# Patient Record
Sex: Female | Born: 1935 | Race: White | Hispanic: No
Health system: Southern US, Community
[De-identification: ages and names within clinical notes are randomized; demographics above are authoritative.]

---

## 2005-03-17 ENCOUNTER — Ambulatory Visit: Payer: Self-pay | Admitting: Family Medicine

## 2006-03-22 ENCOUNTER — Ambulatory Visit: Payer: Self-pay | Admitting: Family Medicine

## 2006-04-19 ENCOUNTER — Ambulatory Visit: Payer: Self-pay | Admitting: Ophthalmology

## 2006-08-26 ENCOUNTER — Ambulatory Visit: Payer: Self-pay | Admitting: Family Medicine

## 2007-05-30 ENCOUNTER — Inpatient Hospital Stay: Payer: Self-pay | Admitting: Otolaryngology

## 2007-06-03 ENCOUNTER — Inpatient Hospital Stay: Payer: Self-pay | Admitting: Internal Medicine

## 2010-02-04 ENCOUNTER — Ambulatory Visit: Payer: Self-pay | Admitting: Family Medicine

## 2010-08-12 ENCOUNTER — Ambulatory Visit: Payer: Self-pay | Admitting: Family Medicine

## 2011-03-20 ENCOUNTER — Emergency Department: Payer: Self-pay | Admitting: Unknown Physician Specialty

## 2012-01-15 ENCOUNTER — Emergency Department: Payer: Self-pay | Admitting: *Deleted

## 2012-03-13 ENCOUNTER — Emergency Department: Payer: Self-pay | Admitting: Emergency Medicine

## 2012-03-13 LAB — URINALYSIS, COMPLETE
Hyaline Cast: 15
Nitrite: POSITIVE
Ph: 6 (ref 4.5–8.0)
RBC,UR: 11 /HPF (ref 0–5)
Specific Gravity: 1.015 (ref 1.003–1.030)
Squamous Epithelial: <1
WBC UR: 40 /HPF (ref 0–5)

## 2012-03-15 ENCOUNTER — Emergency Department: Payer: Self-pay | Admitting: Emergency Medicine

## 2012-03-15 LAB — COMPREHENSIVE METABOLIC PANEL
Alkaline Phosphatase: 65 U/L (ref 50–136)
Anion Gap: 10 (ref 7–16)
BUN: 29 mg/dL — ABNORMAL HIGH (ref 7–18)
Bilirubin,Total: 0.5 mg/dL (ref 0.2–1.0)
Calcium, Total: 9.7 mg/dL (ref 8.5–10.1)
Chloride: 106 mmol/L (ref 98–107)
Co2: 25 mmol/L (ref 21–32)
Creatinine: 1.57 mg/dL — ABNORMAL HIGH (ref 0.60–1.30)
EGFR (African American): 40 — ABNORMAL LOW
Glucose: 95 mg/dL (ref 65–99)
Potassium: 3.6 mmol/L (ref 3.5–5.1)
Sodium: 141 mmol/L (ref 136–145)

## 2012-03-15 LAB — CBC
HGB: 14.2 g/dL (ref 12.0–16.0)
MCH: 31.7 pg (ref 26.0–34.0)
MCHC: 33.7 g/dL (ref 32.0–36.0)
RDW: 13.3 % (ref 11.5–14.5)

## 2012-04-18 ENCOUNTER — Emergency Department: Payer: Self-pay | Admitting: Emergency Medicine

## 2012-04-18 LAB — CBC WITH DIFFERENTIAL/PLATELET
Basophil #: 0.1 10*3/uL (ref 0.0–0.1)
Eosinophil #: 0.2 10*3/uL (ref 0.0–0.7)
HCT: 40.5 % (ref 35.0–47.0)
HGB: 13.8 g/dL (ref 12.0–16.0)
Lymphocyte #: 2.5 10*3/uL (ref 1.0–3.6)
Lymphocyte %: 41.7 %
MCHC: 34 g/dL (ref 32.0–36.0)
MCV: 93 fL (ref 80–100)
Monocyte #: 0.5 x10 3/mm (ref 0.2–0.9)
Monocyte %: 8.9 %
Neutrophil #: 2.7 10*3/uL (ref 1.4–6.5)
Neutrophil %: 45.5 %
Platelet: 171 10*3/uL (ref 150–440)
RBC: 4.37 10*6/uL (ref 3.80–5.20)

## 2012-04-18 LAB — URINALYSIS, COMPLETE
Blood: NEGATIVE
Glucose,UR: NEGATIVE mg/dL (ref 0–75)
Ketone: NEGATIVE
Nitrite: NEGATIVE
Specific Gravity: 1.009 (ref 1.003–1.030)
Transitional Epi: <1

## 2012-04-18 LAB — BASIC METABOLIC PANEL
Calcium, Total: 9.4 mg/dL (ref 8.5–10.1)
Chloride: 108 mmol/L — ABNORMAL HIGH (ref 98–107)
EGFR (African American): 43 — ABNORMAL LOW
EGFR (Non-African Amer.): 37 — ABNORMAL LOW
Glucose: 85 mg/dL (ref 65–99)
Osmolality: 288 (ref 275–301)
Potassium: 4.2 mmol/L (ref 3.5–5.1)
Sodium: 143 mmol/L (ref 136–145)

## 2012-04-20 ENCOUNTER — Emergency Department: Payer: Self-pay | Admitting: Emergency Medicine

## 2012-04-20 LAB — URINE CULTURE

## 2012-04-30 ENCOUNTER — Emergency Department: Payer: Self-pay | Admitting: Emergency Medicine

## 2012-04-30 LAB — URINALYSIS, COMPLETE
Bilirubin,UR: NEGATIVE
Blood: NEGATIVE
Hyaline Cast: 9
Ketone: NEGATIVE
Ph: 7 (ref 4.5–8.0)
Protein: NEGATIVE
RBC,UR: <1 /HPF (ref 0–5)
Squamous Epithelial: 8
WBC UR: 4 /HPF (ref 0–5)

## 2012-04-30 LAB — COMPREHENSIVE METABOLIC PANEL
Albumin: 3.3 g/dL — ABNORMAL LOW (ref 3.4–5.0)
Alkaline Phosphatase: 58 U/L (ref 50–136)
Anion Gap: 8 (ref 7–16)
BUN: 32 mg/dL — ABNORMAL HIGH (ref 7–18)
Chloride: 106 mmol/L (ref 98–107)
Co2: 29 mmol/L (ref 21–32)
Creatinine: 1.34 mg/dL — ABNORMAL HIGH (ref 0.60–1.30)
EGFR (African American): 41 — ABNORMAL LOW
Osmolality: 291 (ref 275–301)
Potassium: 3.9 mmol/L (ref 3.5–5.1)
SGOT(AST): 14 U/L — ABNORMAL LOW (ref 15–37)
SGPT (ALT): 17 U/L
Sodium: 143 mmol/L (ref 136–145)
Total Protein: 6.3 g/dL — ABNORMAL LOW (ref 6.4–8.2)

## 2012-04-30 LAB — CBC
MCV: 94 fL (ref 80–100)
RBC: 4.34 10*6/uL (ref 3.80–5.20)
RDW: 13.6 % (ref 11.5–14.5)

## 2012-04-30 LAB — TROPONIN I: Troponin-I: <0.02 ng/mL

## 2012-05-06 LAB — CULTURE, BLOOD (SINGLE)

## 2012-07-02 IMAGING — CT CT HEAD WITHOUT CONTRAST
1 series · 16 of 30 positions shown, 20 images · non-contrast
Comparison: none

REASON FOR EXAM: fall, struck head
COMMENTS:

PROCEDURE:     CT  - CT HEAD WITHOUT CONTRAST  - April 18, 2012  [DATE]
RESULT:     Comparison:  03/15/2012
TECHNIQUE: Multiple axial images from the foramen magnum to the vertex were
obtained without IV contrast.

[Series 2: soft tissue · axial · 0.44mm/px · z∈[-210,-60]mm · 16 of 34 slices shown, 20 images]
[im 2/34  brain]
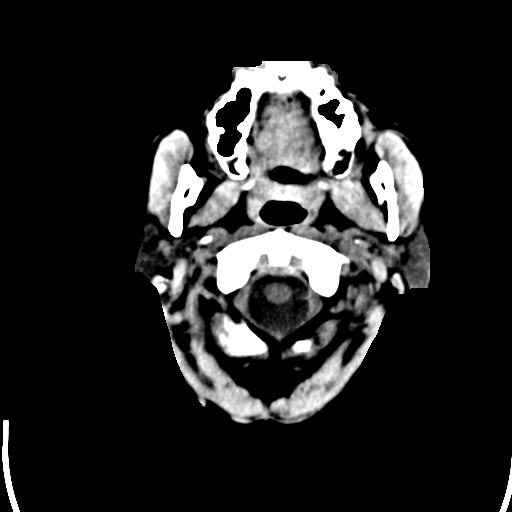
[im 2/34  bone]
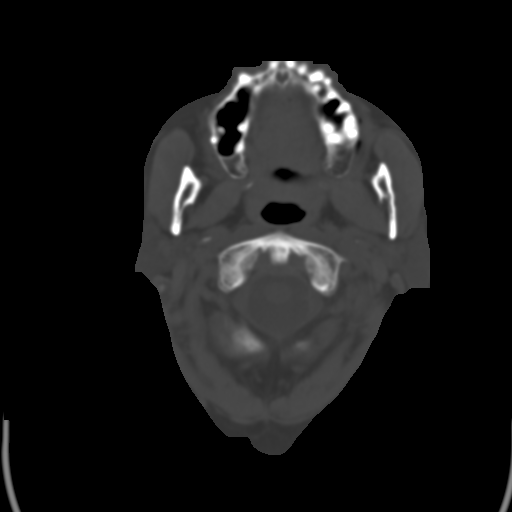
[im 4/34  brain]
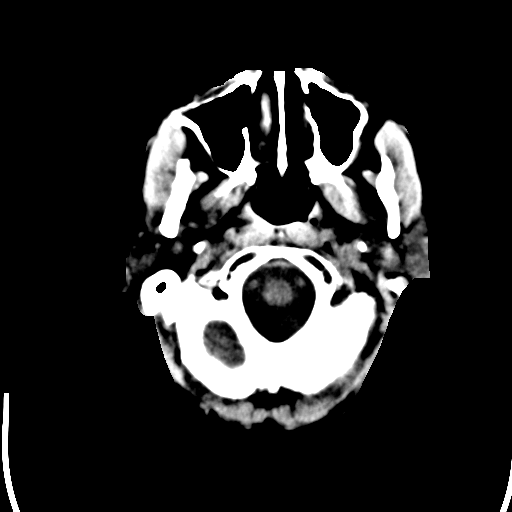
[im 6/34  brain]
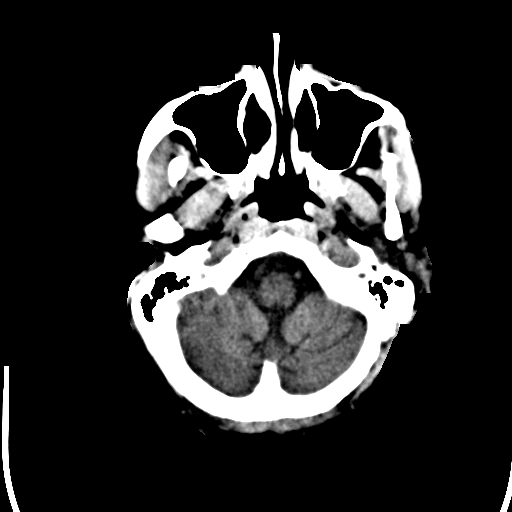
[im 8/34  brain]
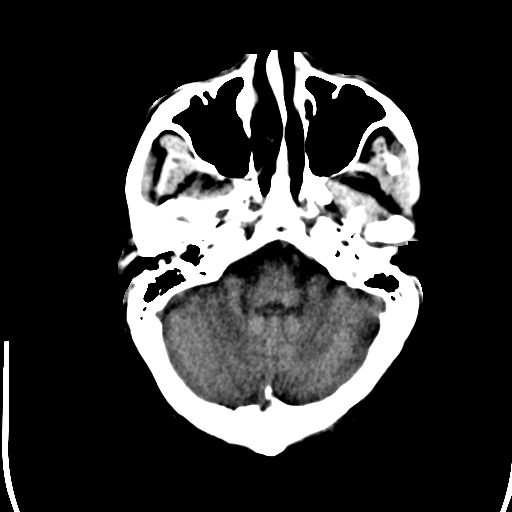
[im 10/34  brain]
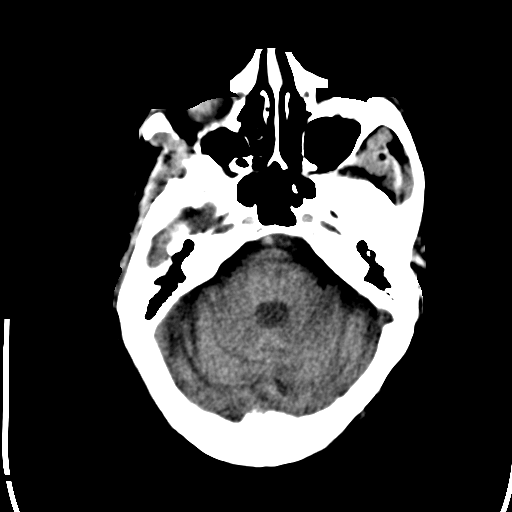
[im 10/34  bone]
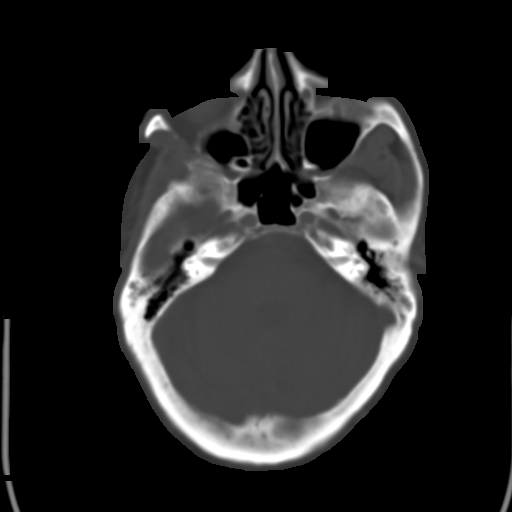
[im 12/34  brain]
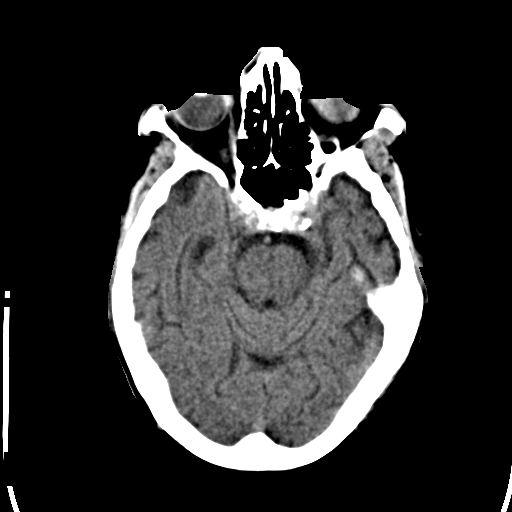
[im 14/34  brain]
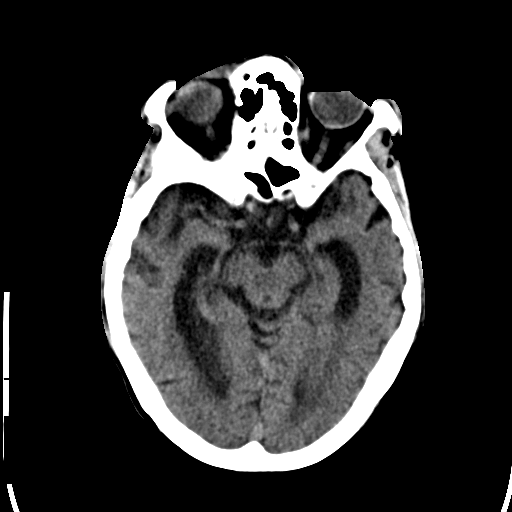
[im 16/34  brain]
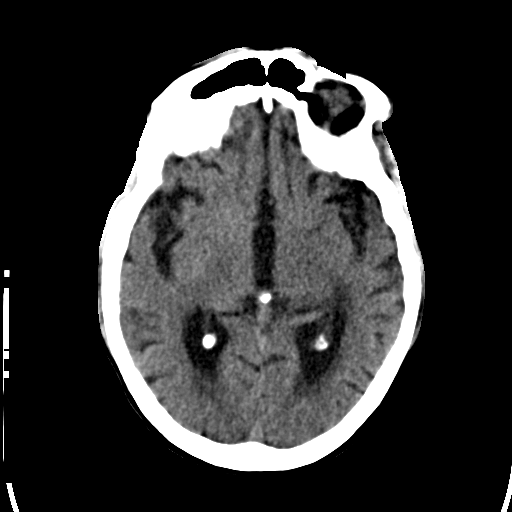
[im 18/34  brain]
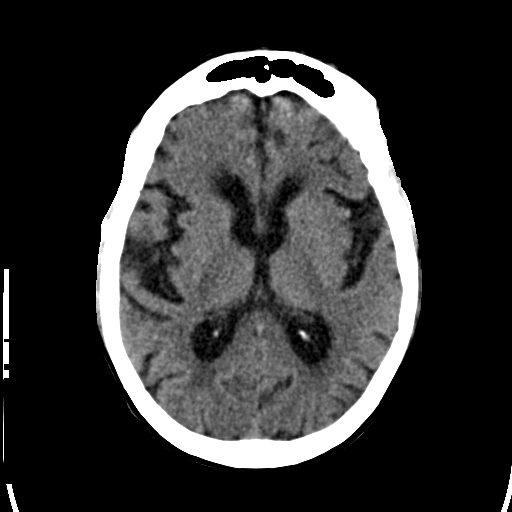
[im 18/34  bone]
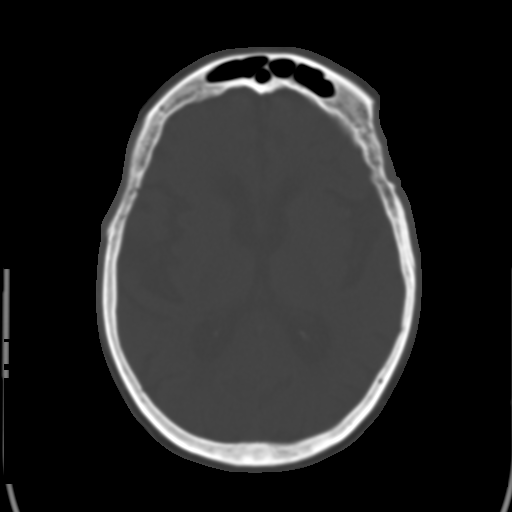
[im 20/34  brain]
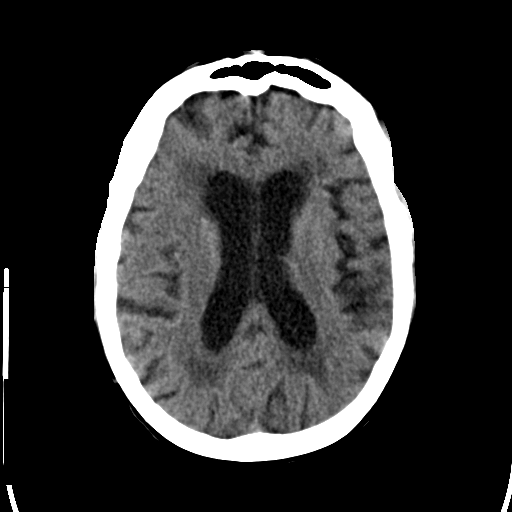
[im 22/34  brain]
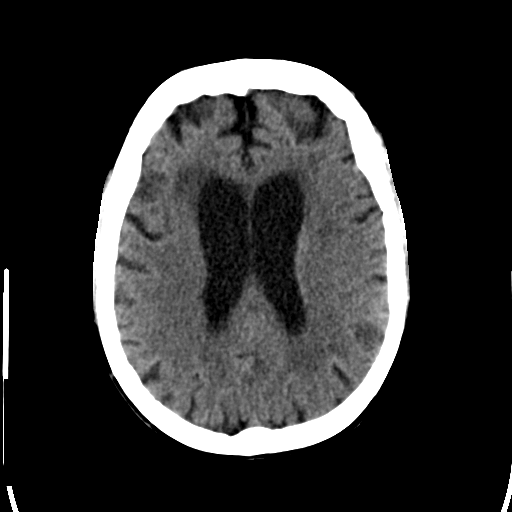
[im 24/34  brain]
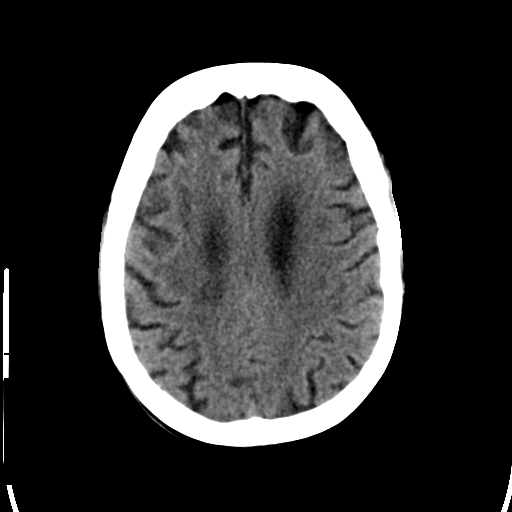
[im 26/34  brain]
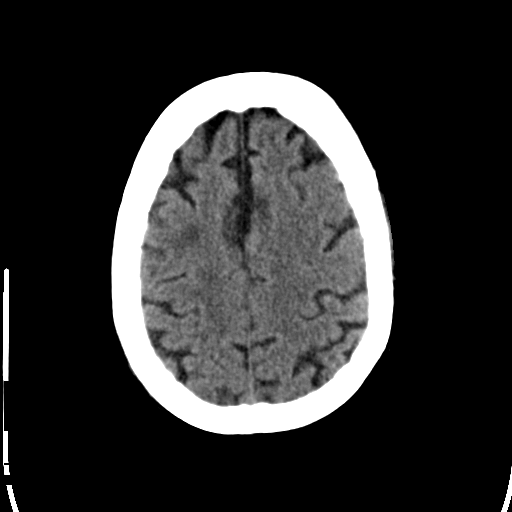
[im 26/34  bone]
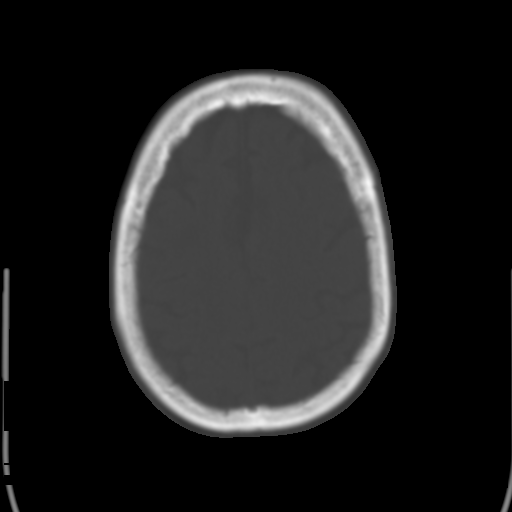
[im 28/34  brain]
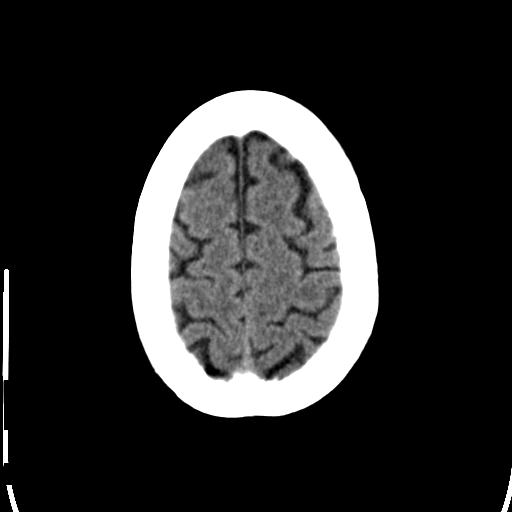
[im 30/34  brain]
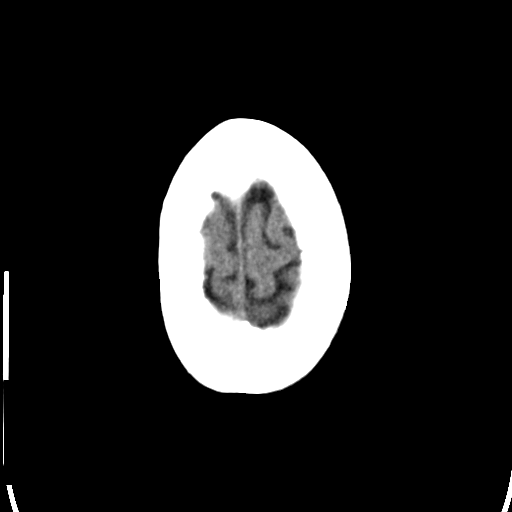
[im 32/34  brain]
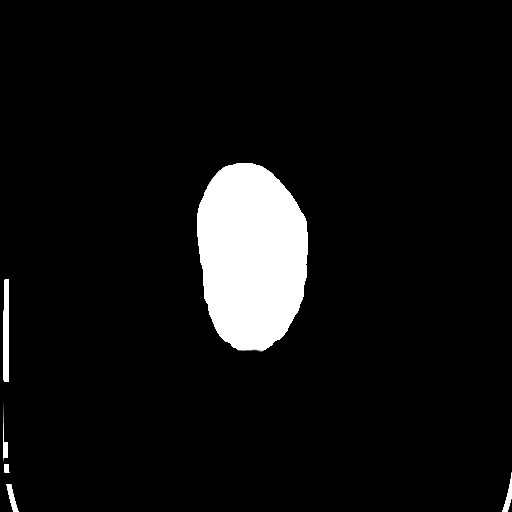

[16 of 30 positions shown; findings below may reference images not displayed]

FINDINGS: There is slight motion artifact at the level of the superior third of the
brain. There is no evidence for mass effect, midline shift, or extra-axial
fluid collections. There is no evidence for space-occupying lesion,
intracranial hemorrhage, or cortical-based area of infarction.
Periventricular and subcortical hypoattenuation is consistent with chronic
small vessel ischemic disease.

The osseous structures are unremarkable.
IMPRESSION: 1. No acute intracranial process.
2. Chronic small vessel ischemic disease.

## 2012-07-04 IMAGING — CR PELVIS - 1-2 VIEW
1 series · 1 of 1 positions shown · non-contrast
Comparison: none

REASON FOR EXAM: left hip pain
COMMENTS:

[t pelvis ap]
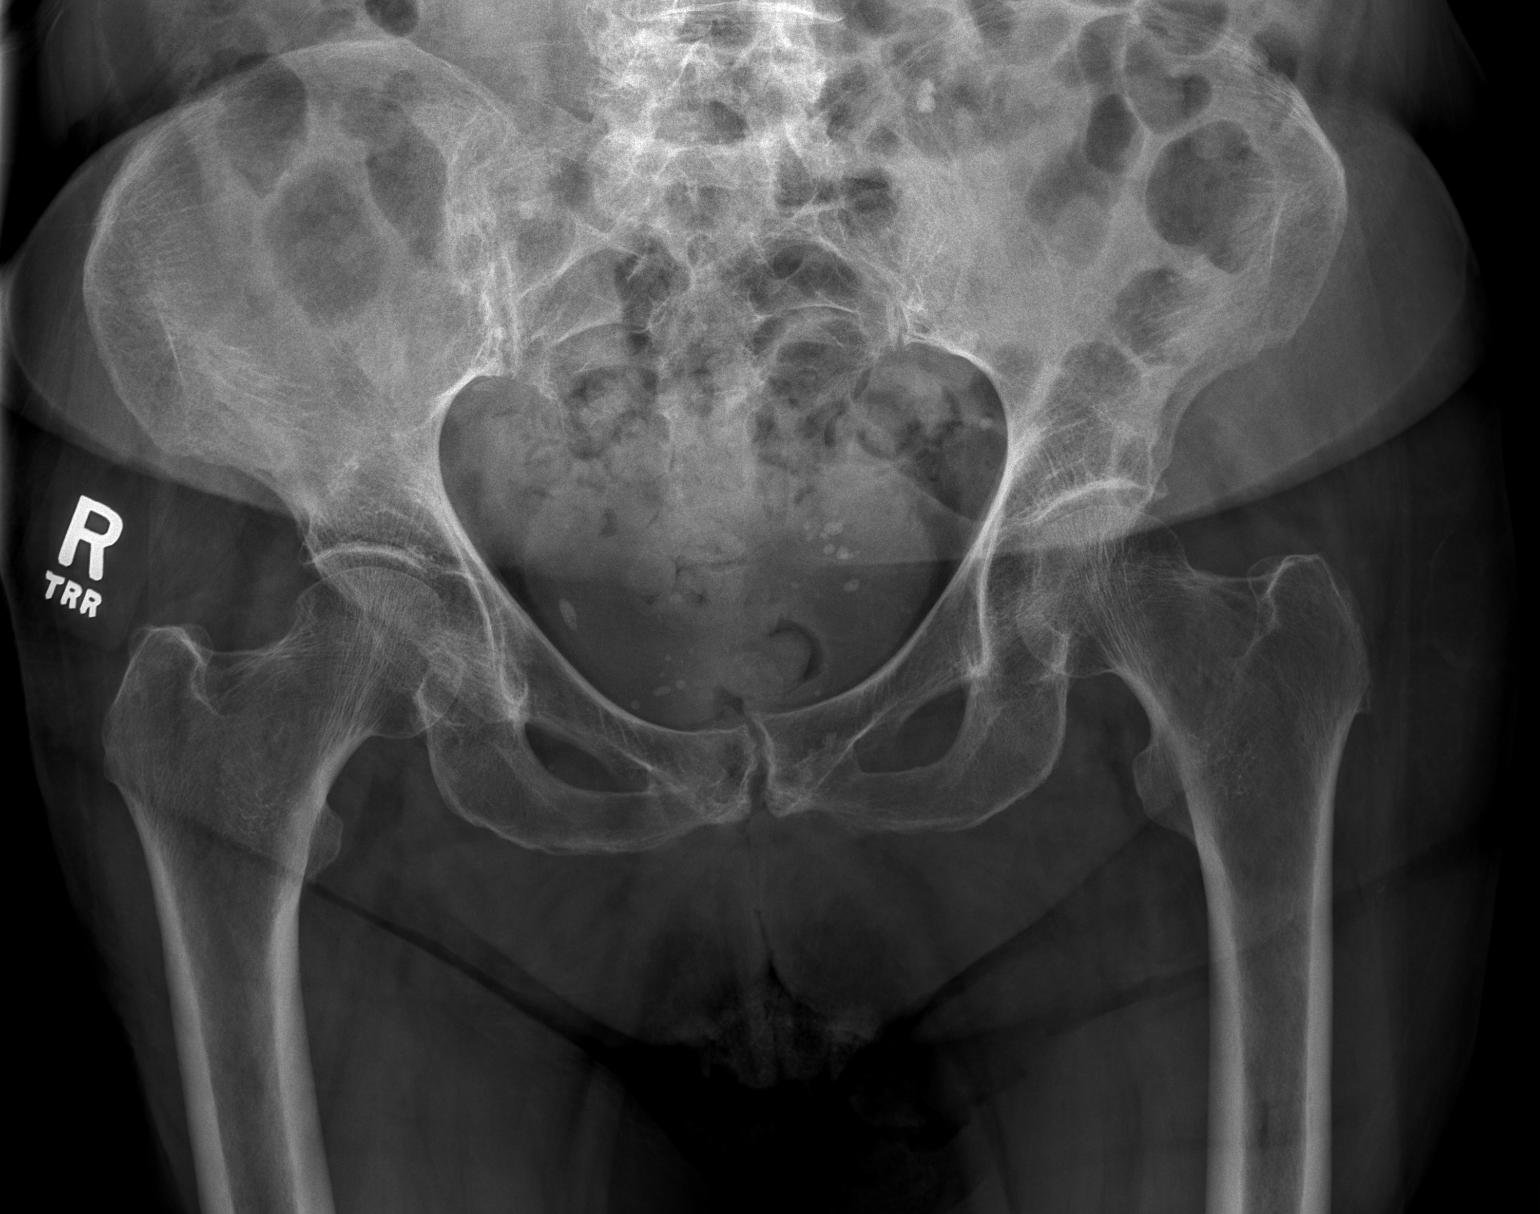

[1 of 1 positions shown; findings below may reference images not displayed]

PROCEDURE:     DXR - DXR PELVIS AP ONLY  - April 20, 2012 [DATE]

RESULT:     Conversion is made to the study 15 January, 2012.

Numerous calcific densities project in the pelvis most likely secondary to
phleboliths. These appear to be unchanged. Pelvis appears intact without
fracture or dislocation. Sacral arches appear intact.
IMPRESSION: 1. No acute bony abnormality evident.

[REDACTED]

## 2012-08-03 ENCOUNTER — Emergency Department: Payer: Self-pay

## 2012-08-03 LAB — CBC
MCHC: 35.2 g/dL (ref 32.0–36.0)
MCV: 93 fL (ref 80–100)
Platelet: 198 10*3/uL (ref 150–440)
RBC: 3.96 10*6/uL (ref 3.80–5.20)
RDW: 13.3 % (ref 11.5–14.5)
WBC: 7.2 10*3/uL (ref 3.6–11.0)

## 2012-08-03 LAB — BASIC METABOLIC PANEL
Anion Gap: 8 (ref 7–16)
Calcium, Total: 9.3 mg/dL (ref 8.5–10.1)
Chloride: 107 mmol/L (ref 98–107)
Glucose: 103 mg/dL — ABNORMAL HIGH (ref 65–99)
Osmolality: 283 (ref 275–301)
Potassium: 4 mmol/L (ref 3.5–5.1)

## 2012-08-03 LAB — TROPONIN I: Troponin-I: <0.02 ng/mL

## 2013-01-14 ENCOUNTER — Ambulatory Visit: Payer: Self-pay | Admitting: Internal Medicine

## 2013-02-04 ENCOUNTER — Observation Stay: Payer: Self-pay | Admitting: Internal Medicine

## 2013-02-04 LAB — COMPREHENSIVE METABOLIC PANEL
Albumin: 2.6 g/dL — ABNORMAL LOW (ref 3.4–5.0)
Alkaline Phosphatase: 100 U/L (ref 50–136)
BUN: 23 mg/dL — ABNORMAL HIGH (ref 7–18)
Bilirubin,Total: 0.9 mg/dL (ref 0.2–1.0)
Chloride: 108 mmol/L — ABNORMAL HIGH (ref 98–107)
Creatinine: 0.97 mg/dL (ref 0.60–1.30)
EGFR (Non-African Amer.): 52 — ABNORMAL LOW
Osmolality: 282 (ref 275–301)
Potassium: 4.4 mmol/L (ref 3.5–5.1)
Sodium: 139 mmol/L (ref 136–145)
Total Protein: 7.2 g/dL (ref 6.4–8.2)

## 2013-02-04 LAB — URINALYSIS, COMPLETE
Blood: NEGATIVE
Nitrite: POSITIVE
Protein: 30
Squamous Epithelial: <1
WBC UR: 19 /HPF (ref 0–5)

## 2013-02-04 LAB — CBC
MCH: 30.2 pg (ref 26.0–34.0)
MCHC: 34.2 g/dL (ref 32.0–36.0)
Platelet: 262 10*3/uL (ref 150–440)
WBC: 14 10*3/uL — ABNORMAL HIGH (ref 3.6–11.0)

## 2013-02-05 LAB — CBC WITH DIFFERENTIAL/PLATELET
Basophil #: 0 10*3/uL (ref 0.0–0.1)
Eosinophil #: 0 10*3/uL (ref 0.0–0.7)
Lymphocyte #: 1.3 10*3/uL (ref 1.0–3.6)
Lymphocyte %: 11.7 %
MCV: 88 fL (ref 80–100)
Monocyte #: 0.8 x10 3/mm (ref 0.2–0.9)
Monocyte %: 7.6 %
Neutrophil #: 8.9 10*3/uL — ABNORMAL HIGH (ref 1.4–6.5)
Platelet: 227 10*3/uL (ref 150–440)
RBC: 4.16 10*6/uL (ref 3.80–5.20)
RDW: 13.2 % (ref 11.5–14.5)
WBC: 11.1 10*3/uL — ABNORMAL HIGH (ref 3.6–11.0)

## 2013-02-05 LAB — COMPREHENSIVE METABOLIC PANEL
Albumin: 2.2 g/dL — ABNORMAL LOW (ref 3.4–5.0)
Anion Gap: 7 (ref 7–16)
BUN: 22 mg/dL — ABNORMAL HIGH (ref 7–18)
Bilirubin,Total: 0.6 mg/dL (ref 0.2–1.0)
Calcium, Total: 8.4 mg/dL — ABNORMAL LOW (ref 8.5–10.1)
Chloride: 110 mmol/L — ABNORMAL HIGH (ref 98–107)
Co2: 22 mmol/L (ref 21–32)
EGFR (Non-African Amer.): >60
Osmolality: 282 (ref 275–301)
Potassium: 4.3 mmol/L (ref 3.5–5.1)
SGOT(AST): 14 U/L — ABNORMAL LOW (ref 15–37)
Total Protein: 6.1 g/dL — ABNORMAL LOW (ref 6.4–8.2)

## 2013-02-05 LAB — MAGNESIUM: Magnesium: 2.1 mg/dL

## 2013-02-06 LAB — URINE CULTURE

## 2013-02-10 LAB — CULTURE, BLOOD (SINGLE)

## 2013-03-14 ENCOUNTER — Ambulatory Visit: Payer: Self-pay | Admitting: Internal Medicine

## 2013-03-14 DEATH — deceased

## 2015-04-05 NOTE — H&P (Signed)
DATE OF BIRTH:  July 25, 1921  DATE OF ADMISSION:  02/04/2013  REFERRING PHYSICIAN:  Dr. Mindi Junker  PRIMARY CARE PHYSICIAN:  [facility]  CHIEF COMPLAINT:  Altered mental status, agitation and confusion, fever.   HISTORY OF PRESENT ILLNESS:  This is an 81 year old Caucasian female with past medical history of osteoarthritis, recent pneumonia, recent diagnosis of left lower extremity DVT, advanced dementia, left knee replacement in 2006, who presented with the above chief complaint by the healthcare power of attorney, who are the 2 daughters.  Daughters stated that patient currently resides at [facility] located in [place], [place]. She was starting to have some fever and worsening altered mental status, with leg swelling. Ultrasound of the lower extremities was done yesterday and found to have a significant clot burden in the left lower extremity. This was told by the [facility] physician to the family. The family decided at this time to not proceed with any anticoagulation or thrombectomy or any further workup. They stated that patient is a DNR/DNI and they would like the patient to be hospice care. Over the last 24 hours with diagnosis of left lower extremity DVT, this morning patient was noted to have worsening altered mental status, confusion and continued periods of fever, with some associated chills. She did become more confused and then became "mute", and stopped talking for a short period of time. Upon arrival to the hospital, she was noted to be talking again and verbalizing. She has had decreased appetite in the last 24 hours, and she has not had anything to drink, by the patient's choice, in the last 24 hours also. Daughters, who are the healthcare power of attorney and at the bedside, have elected to not pursue aggressive intervention with the patient. They do not want a CT scan of the head, LP, thrombectomy, anticoagulation in the form of therapeutic doses for the  left lower extremity DVT, or anticoagulation for prophylaxis of DVT. No SCDs and no TED  stockings, either. Hospitalist services were consulted for further inpatient workup and management. Primary care physician:  [facility]  PAST MEDICAL HISTORY: Osteoarthritis, dementia, right knee replacement, pneumonia 2 weeks ago, treated with Levaquin, recent diagnosis of left lower extremity DVT.   HOME MEDICATIONS ARE AS FOLLOWS:  Abilify 2 mg 1 tab p.o. daily, alprazolam 0.25 mg 1 tab every 24 hours as needed for anxiety,  Artificial Tears 1 drop to each eye 2 times a day, citalopram 20 mg 1 tab daily at bedtime, cranberry oral capsule 475 mg b.i.d., donepezil 5 mg 1 tab at bedtime, furosemide 20 mg 1 tab at bedtime, gabapentin 100 mg 1 capsule t.i.d., guaifenesin 100/5 mL 2 tsp every 6 hours as needed for cough, not to exceed four doses in 24 hours. Imodium AD 2 mg tablets 1 tab as needed for diarrhea with loose stool, not to exceed 8 doses in 24 hours, ipratropium nasal spray 2 sprays each nostril b.i.d. as needed for congestion, 0.3% spray, Tylenol 500 mg 1 tab every 4 hours as needed for pain, milk of magnesia 8% oral suspension 30 mL once a day as needed for constipation, Mylanta 30 mL, again, as needed for constipation, promethazine 12.5 mg 1 tab every 6 hours as needed for nausea and vomiting, spironolactone 25 mg 1 tab once a day.   ALLERGIES:  ASPIRIN, CELEBREX, ERYTHROMYCIN, PENICILLIN, POTASSIUM.   PAST SURGICAL HISTORY:  Left knee replacement in 2006.  FAMILY HISTORY:  Daughter with breast cancer.   SOCIAL HISTORY: Nonsmoker, nondrinker.  Previously living at [facility], but has since moved in the last year or two to [facility] due to  progression of dementia.  REVIEW OF SYSTEMS:  Positive for fever, fatigue, weakness, left lower extremity pain.  EYES:  No blurred vision, double vision, pain or redness.  EARS, NOSE, THROAT:  No tinnitus, ear pain, hearing loss.  RESPIRATORY:   Chronic cough, no wheezing, hemoptysis. No worsening shortness of breath.  CARDIOVASCULAR:  No chest pain. No edema. No arrhythmia or dyspnea on exertion. Marland Kitchen  GASTROINTESTINAL:  No nausea, vomiting, diarrhea. No abdominal pain.  GENITOURINARY:  No dysuria, hematuria. ENDOCRINE:  No polyuria, nocturia or thyroid problems.  HEMATOLOGIC/LYMPHATIC: No anemia, easy bleeding or bruising. No swollen glands.  INTEGUMENT:  No acne. Positive erythema of left lower extremity. No change in mole or skin.  MUSCULOSKELETAL: Does not admit to any neck, back, shoulder, knee or hip pain. Mild chronic osteoarthritis.  NEUROLOGIC: Positive weakness, altered mental status, dementia.  PSYCHIATRIC:  Anxiety.   PHYSICAL EXAMINATION: VITAL SIGNS: While in the ED, blood pressure was 124/65, temperature 98.2, pulse 74, respirations 22.  GENERAL APPEARANCE:  A well-developed, well-nourished, mildly obese female lying in bed in no acute respiratory distress.   HEENT: PERRLA, EOMI. No scleral icterus. No difficulty hearing. TMs are intact. There is no pharyngeal erythema. Mucous membranes on the lips are dry, with moderate cracking. Dentition is fair.  NECK: No thyroid enlargement, nodules or tenderness. Neck is supple, nontender. No adenopathy noted. No JVD or carotid bruits. Full range of motion is noted.  RESPIRATORY:  Coarse upper airway sounds. No rales, rhonchi, crackles. Mild diminished breath sounds at the bases otherwise. Good airway entry and effort.  CARDIOVASCULAR:  Regular rate, regular rhythm. No murmurs. S1, S2 auscultated.  CHEST:  Nontender. No edema is noted.  ABDOMEN:  Soft, nontender, nondistended. Positive bowel sounds.  MUSCULOSKELETAL:  Strength 4/5 bilateral upper extremities and right lower extremity. Left lower extremity with 3+ strength.  SKIN: Positive erythema and swelling of the left lower extremity from the foot up to the knee. Tenderness to mild palpation. The left lower extremity is about  2-1/2 times bigger than the right lower extremity. There is a known DVT there.   LYMPHATIC:  No adenopathy in the cervical, axillary or supraclavicular regions.  NEUROLOGIC:  Cranial nerves II through XII intact. Deep tendon reflexes are intact.  PSYCHIATRIC:  Alert, not oriented to person, time or place. Cooperative. Poor judgment is noted. Memory is impaired. Patient is confused. No agitation at this time.  LABS ARE AS FOLLOWS:  Sodium 139, potassium 4.4, chloride 108, bicarb 22, BUN 23, creatinine 0.97, glucose 110. Osmolality is 282, AST/ALT 18/14. White cell count 14.0,  hemoglobin 13.3, hematocrit 38.9, platelet count 262. UA  showed 30+ protein, positive nitrite, positive leukocyte esterase, 19 white blood cells and 3+ bacteria. Her EKG shows normal sinus rhythm, no acute ST-T wave changes, rate of about 72.   ASSESSMENT AND PLAN: An 81 year old female with past medical history of advanced dementia, osteoarthritis, left knee replacement, newly diagnosed left lower extremity deep vein thrombosis, now with altered mental status and fever.   1.  Altered mental status. Multifactorial, could be secondary to urinary tract infection and left lower extremity deep vein thrombosis. Will treat urinary tract infection with Levaquin IV and IV fluids. Family has elected to not pursue aggressive workup and intervention. Hence, no CT of the head, no anticoagulation, no evaluation for thrombectomy. No further aggressive measures. Will treat  her lower extremity deep vein thrombosis. Will continue with supportive care. Conservative use of benzodiazepines and narcotics in this elderly patient.     2. Urinary tract infection. Positive urinalysis, as stated above in the lab section. Will check a urine culture, continue with IV Levaquin, and then transition to p.o. over the next 2 or 3 days. Continue with IV fluids. Her urinary tract infection could also be adding to altered mental status and fever, in addition to her  left lower extremity deep vein thrombosis.   3.  Left lower extremity deep vein thrombosis. This was diagnosed by ultrasound at [facility] by the covering physician there.  Family, at this time, again, has elected for no aggressive measures, no anticoagulation, evaluation for thrombectomy, either. This also could be adding to the fever and altered mental status. Will treat with Tylenol for pain, use morphine conservatively if the pain is increasing without improvement with Tylenol, given she has altered mental status and dementia. Family has also decided against any deep vein thrombosis prophylaxis in the form of TEDs hose, sequential compression devices or anticoagulation. Risks and benefits were explained to the family and, again, they have decided to not pursue any deep vein thrombosis prophylaxis or treatment of her left lower extremity DVT.  4. Leukocytosis secondary to urinary tract infection and left lower extremity deep vein thrombosis. Management as stated above.  5. Fever. Will treat with Tylenol and conservative management. Pain management also conservatively with morphine.  6.  Social. Previously at [facility], now being evaluated for hospice care.   7.  Dementia. Continue with Abilify and donepezil.  8.  Gastrointestinal prophylaxis. Ranitidine.  9.  Deep vein thrombosis prophylaxis. None, as per request of family.  10.  The patient is noted to be a DNR/DNI.   Time spent dictating and evaluating patient:  45 minutes.     ____________________________ Stephanie AcreVishal Arnold Depinto, MD vm:mr D: 02/04/2013 18:22:00 ET T: 02/04/2013 22:07:15 ET JOB#: [id number]  cc: Stephanie AcreVishal Brynnley Dayrit, MD, <Dictator> Stephanie AcreVISHAL Chun Sellen MD ELECTRONICALLY SIGNED 02/07/2013 8:40

## 2015-04-05 NOTE — Discharge Summary (Signed)
PATIENT NAME:  Jill HartiganCOOPER, Jill W MR#:  [id number] DATE OF BIRTH:  10/04/1921  DATE OF ADMISSION:  02/04/2013 DATE OF DISCHARGE:  02/07/2013  DISPOSITION: To hospice home.   DISCHARGE DIAGNOSES: Left leg deep vein thrombosis, dementia, urinary tract infection, debility.  CODE STATUS: DO NOT RESUSCITATE.   CONSULTATIONS: Palliative care consult; hospice screening. The patient is discharged to hospice home.   HOSPITAL COURSE: An 81 year old female patient with dementia, osteoarthritis, left leg deep vein thrombosis, admitted for increased confusion and agitation. The patient was found to have a UTI and also a left leg DVT. The patient's daughters and family did not want medical therapy for DVT. Admitted her for fluids, IV antibiotics. The patient's family decided for hospice home placement.   Palliative care consulted. Patient's p.o. intake continues to be poor, with only taking sips, and family agreed for transfer to hospice home, and patient was transferred to hospice home today.   The patient's other medical history is significant for osteoarthritis and dementia.   Patient's lab data showed on admission a sodium of 139, potassium 4.4, chloride 108, bicarbonate 22, BUN 23, creatinine 0.97, glucose 110. The patient's  WBC was 14, hemoglobin 10.2. UA showed 3+ bacteria.   EKG: No ST-T changes.   Ultrasound showed left leg DVT. The patient's urine culture showed E. coli UTI sensitive to multiple antibiotics, sensitive to ropcehin,. She was on Levaquin, but started on Rocephin yesterday, but because of continued decline in her overall status with poor p.o. intake family decided palliative care. The patient's family did not want any anticoagulation for DVT, or ABX for UTI.and the patient is being transferred to a hospice home today. The patient will be on Roxanol and Ativan as needed per hospice home orders.   Time Spent on discharge preparation: More than 30 minutes.      ____________________________ Katha HammingSnehalatha Lorrin Bodner, MD sk:dm D: 02/07/2013 14:51:00 ET T: 02/07/2013 15:24:47 ET JOB#: [id number]  cc: Katha HammingSnehalatha Abigale Dorow, MD, <Dictator> Katha HammingSNEHALATHA Yanelis Osika MD ELECTRONICALLY SIGNED 02/14/2013 8:24

## 2015-04-05 NOTE — H&P (Signed)
Past Med/Surgical Hx:  osteoporosis:   osteoarthritis:   Dementia:   tonsillectomy:   hysterectomy:   Knee Surgery - Right:   ALLERGIES:  Penicillin: Unknown  Celebrex: Other  Erythromycin Base: Other  Aspirin: Other  Penicillin V Potassium: Unknown   Assessment/Admission Diagnosis 81 yo F with PMHx of advanced dementia, OA, L Knee replacement, L leg DVT now with AMS, fever  1. AMS - secondary to UTI and L LE DVT - will treat UTI with rocephin and IVFs - family has elected to not pursue aggressive workup and intervention, hence no CT head, no anticoagulation. - cont with supportive care - conservative use of benzos and narcotics in this elderly patient  2. UTI -  - check urine cx - continue with levaquin IV then transition to levaquin PO - IVFs - this could also be adding to her AMS and fever  3. Left LE DVT - family (HCPOA - daughters) does not want aggressive measures, no anticoagulation, no evaluation for  thrombectomy - most likely this is adding to her fever and AMS - tylenol for pain, will use morphine conservatively if needed.   4. Leukocytosis - secondary to UTI and LLE DVT - management as above - for fever will treat with tylenol  5. Social - previously at [facility] now be evaluated for hospice care  6. Dementia - continue with abilify, donepezil  7. GI prophylaxix - ranitidine  8. DVT prophylaxis - family has refused Ted stocking, Heparin and SCDs.  Risk and benefits of DVT prophylaxis explained to HCPOA (daughter), at this time no DVT prophylaxis.  DNR\DNI PMD - [facility]  Time spent evaluating patient = 45 minutes [id number]   Electronic Signatures: Stephanie AcreMungal, [place] Mckeough (MD)  (Signed [phone]-Feb-14 18:23)  Authored: PAST MEDICAL/SURGIAL HISTORY, ALLERGIES, HOME MEDICATIONS, ASSESSMENT AND PLAN   Last Updated: 22-Feb-14 18:23 by Stephanie AcreMungal, Tanisia Yokley (MD)

## 2015-04-05 NOTE — Consult Note (Signed)
   Comments   I met with pt's daughter. Family describes a dramatic decline in pt leading up to this hospitalization. Daughter understands that pt is being treated for a UTI and also just dx'd with DVT. However, daughter does not feel that pt is going to improve with treatment. We decided to continue aggressive tx for 24hrs with IV abx and IVF and see how pt is in AM. If she has not improved, will transfer to [facility]. If she has improved, consider return to [facility] with hospice following.   Electronic Signatures: Cullin Dishman, Izora Gala (MD)  (Signed 24-Feb-14 11:27)  Authored: Palliative Care   Last Updated: 24-Feb-14 11:27 by Jaeliana Lococo, Izora Gala (MD)
